# Patient Record
Sex: Female | Born: 1960 | Race: White | Hispanic: No | Marital: Married | Smoking: Never smoker
Health system: Southern US, Community
[De-identification: ages and names within clinical notes are randomized; demographics above are authoritative.]

## PROBLEM LIST (undated history)

## (undated) DIAGNOSIS — G43909 Migraine, unspecified, not intractable, without status migrainosus: Secondary | ICD-10-CM

## (undated) DIAGNOSIS — E079 Disorder of thyroid, unspecified: Secondary | ICD-10-CM

## (undated) DIAGNOSIS — N200 Calculus of kidney: Secondary | ICD-10-CM

## (undated) DIAGNOSIS — M810 Age-related osteoporosis without current pathological fracture: Secondary | ICD-10-CM

## (undated) HISTORY — PX: OTHER SURGICAL HISTORY: SHX169

## (undated) HISTORY — PX: WRIST SURGERY: SHX841

## (undated) HISTORY — PX: TONSILLECTOMY: SUR1361

---

## 2014-09-01 ENCOUNTER — Emergency Department (HOSPITAL_COMMUNITY): Payer: BLUE CROSS/BLUE SHIELD

## 2014-09-01 ENCOUNTER — Emergency Department (HOSPITAL_COMMUNITY)
Admission: EM | Admit: 2014-09-01 | Discharge: 2014-09-01 | Disposition: A | Discharge status: Home / Self Care | Payer: BLUE CROSS/BLUE SHIELD | Attending: Emergency Medicine | Admitting: Emergency Medicine

## 2014-09-01 ENCOUNTER — Encounter (HOSPITAL_COMMUNITY): Payer: Self-pay | Admitting: *Deleted

## 2014-09-01 DIAGNOSIS — Y92410 Unspecified street and highway as the place of occurrence of the external cause: Secondary | ICD-10-CM | POA: Insufficient documentation

## 2014-09-01 DIAGNOSIS — Z79899 Other long term (current) drug therapy: Secondary | ICD-10-CM | POA: Insufficient documentation

## 2014-09-01 DIAGNOSIS — Y9389 Activity, other specified: Secondary | ICD-10-CM | POA: Diagnosis not present

## 2014-09-01 DIAGNOSIS — M810 Age-related osteoporosis without current pathological fracture: Secondary | ICD-10-CM | POA: Insufficient documentation

## 2014-09-01 DIAGNOSIS — Z8679 Personal history of other diseases of the circulatory system: Secondary | ICD-10-CM | POA: Diagnosis not present

## 2014-09-01 DIAGNOSIS — S2222XA Fracture of body of sternum, initial encounter for closed fracture: Secondary | ICD-10-CM | POA: Insufficient documentation

## 2014-09-01 DIAGNOSIS — Y998 Other external cause status: Secondary | ICD-10-CM | POA: Insufficient documentation

## 2014-09-01 DIAGNOSIS — R079 Chest pain, unspecified: Secondary | ICD-10-CM

## 2014-09-01 DIAGNOSIS — Z87442 Personal history of urinary calculi: Secondary | ICD-10-CM | POA: Diagnosis not present

## 2014-09-01 DIAGNOSIS — T1490XA Injury, unspecified, initial encounter: Secondary | ICD-10-CM

## 2014-09-01 DIAGNOSIS — S299XXA Unspecified injury of thorax, initial encounter: Secondary | ICD-10-CM | POA: Diagnosis present

## 2014-09-01 DIAGNOSIS — E079 Disorder of thyroid, unspecified: Secondary | ICD-10-CM | POA: Insufficient documentation

## 2014-09-01 DIAGNOSIS — S2220XA Unspecified fracture of sternum, initial encounter for closed fracture: Secondary | ICD-10-CM

## 2014-09-01 HISTORY — DX: Disorder of thyroid, unspecified: E07.9

## 2014-09-01 HISTORY — DX: Age-related osteoporosis without current pathological fracture: M81.0

## 2014-09-01 HISTORY — DX: Calculus of kidney: N20.0

## 2014-09-01 HISTORY — DX: Migraine, unspecified, not intractable, without status migrainosus: G43.909

## 2014-09-01 MED ORDER — IOHEXOL 300 MG/ML  SOLN
80.0000 mL | Freq: Once | INTRAMUSCULAR | Status: AC | PRN
  Administered 2014-09-01: 80 mL via INTRAVENOUS

## 2014-09-01 MED ORDER — SODIUM CHLORIDE 0.9 % IV BOLUS (SEPSIS)
500.0000 mL | Freq: Once | INTRAVENOUS | Status: AC
  Administered 2014-09-01: 500 mL via INTRAVENOUS
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
## 2014-09-01 NOTE — ED Notes (Signed)
EDP at bedside  
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
## 2018-01-29 DIAGNOSIS — J209 Acute bronchitis, unspecified: Secondary | ICD-10-CM | POA: Diagnosis not present

## 2018-01-29 DIAGNOSIS — J019 Acute sinusitis, unspecified: Secondary | ICD-10-CM | POA: Diagnosis not present

## 2018-02-27 DIAGNOSIS — Z0182 Encounter for allergy testing: Secondary | ICD-10-CM | POA: Diagnosis not present

## 2018-02-27 DIAGNOSIS — M818 Other osteoporosis without current pathological fracture: Secondary | ICD-10-CM | POA: Diagnosis not present

## 2018-03-14 DIAGNOSIS — M818 Other osteoporosis without current pathological fracture: Secondary | ICD-10-CM | POA: Diagnosis not present

## 2018-03-14 DIAGNOSIS — Z1231 Encounter for screening mammogram for malignant neoplasm of breast: Secondary | ICD-10-CM | POA: Diagnosis not present

## 2018-06-07 DIAGNOSIS — J0100 Acute maxillary sinusitis, unspecified: Secondary | ICD-10-CM | POA: Diagnosis not present

## 2018-06-12 DIAGNOSIS — R42 Dizziness and giddiness: Secondary | ICD-10-CM | POA: Diagnosis not present
